# Patient Record
Sex: Female | Born: 1970 | Race: White | Hispanic: No | Marital: Married | Smoking: Never smoker
Health system: Southern US, Community
[De-identification: ages and names within clinical notes are randomized; demographics above are authoritative.]

## PROBLEM LIST (undated history)

## (undated) ENCOUNTER — Ambulatory Visit

## (undated) DIAGNOSIS — F419 Anxiety disorder, unspecified: Secondary | ICD-10-CM

## (undated) DIAGNOSIS — E119 Type 2 diabetes mellitus without complications: Secondary | ICD-10-CM

## (undated) HISTORY — DX: Anxiety disorder, unspecified: F41.9

## (undated) HISTORY — DX: Type 2 diabetes mellitus without complications: E11.9

---

## 2012-10-21 ENCOUNTER — Ambulatory Visit (INDEPENDENT_AMBULATORY_CARE_PROVIDER_SITE_OTHER): Payer: BC Managed Care – PPO | Admitting: Physician Assistant

## 2012-10-21 VITALS — BP 109/74 | HR 89 | Temp 98.5°F | Resp 16 | Ht 64.5 in | Wt 178.0 lb

## 2012-10-21 DIAGNOSIS — R509 Fever, unspecified: Secondary | ICD-10-CM

## 2012-10-21 DIAGNOSIS — J029 Acute pharyngitis, unspecified: Secondary | ICD-10-CM

## 2012-10-21 LAB — POCT RAPID STREP A (OFFICE): Rapid Strep A Screen: NEGATIVE

## 2012-10-21 MED ORDER — AMOXICILLIN 875 MG PO TABS
875.0000 mg | ORAL_TABLET | Freq: Two times a day (BID) | ORAL | Status: DC
Start: 2012-10-21 — End: 2019-11-02

## 2012-10-21 NOTE — Progress Notes (Signed)
  Subjective:    Patient ID: Gabriela Fleming, female    DOB: Mar 18, 1971, 41 y.o.   MRN: [id number]  HPI 41 year old female presents with acute onset of sore throat 3 days ago associated with fevers, chills, and body aches.  Denies cough, nasal congestion, otalgia, headache, nausea, or vomiting. Last fever was on 12/7 and she has been afebrile since. Complains of severe sore throat with painful swallowing and swollen glands. She is a 1st grade teacher and does admit that there are several students with strep. No personal history of strep.  She has DM and HTN - controlled.      Review of Systems  Constitutional: Negative for fever and chills.  HENT: Positive for sore throat. Negative for congestion, rhinorrhea, neck pain and postnasal drip.   Respiratory: Negative for cough.   Gastrointestinal: Negative for nausea and vomiting.  Skin: Negative for rash.  All other systems reviewed and are negative.       Objective:   Physical Exam  Constitutional: She is oriented to person, place, and time. She appears well-developed and well-nourished.  HENT:  Head: Normocephalic and atraumatic.  Right Ear: Hearing, tympanic membrane, external ear and ear canal normal.  Left Ear: Hearing, tympanic membrane, external ear and ear canal normal.  Mouth/Throat: Uvula is midline and mucous membranes are normal. Oropharyngeal exudate and posterior oropharyngeal erythema present. No posterior oropharyngeal edema or tonsillar abscesses.  Eyes: Conjunctivae normal are normal.  Neck: Normal range of motion.  Cardiovascular: Normal rate, regular rhythm and normal heart sounds.   Pulmonary/Chest: Effort normal and breath sounds normal.  Lymphadenopathy:    She has no cervical adenopathy.  Neurological: She is alert and oriented to person, place, and time.  Psychiatric: She has a normal mood and affect. Her behavior is normal. Judgment and thought content normal.          Assessment & Plan:   1. Acute  pharyngitis  POCT rapid strep A, Culture, Group A Strep, amoxicillin (AMOXIL) 875 MG tablet  Throat culture sent Will cover with Amoxicillin due to exposure Increase fluids and rest Tylenol and ibuprofen as needed for pain Follow up if symptoms worsen or fail to improve

## 2012-10-23 LAB — CULTURE, GROUP A STREP: Organism ID, Bacteria: NORMAL

## 2013-05-14 DIAGNOSIS — E785 Hyperlipidemia, unspecified: Secondary | ICD-10-CM | POA: Insufficient documentation

## 2014-03-06 DIAGNOSIS — E1165 Type 2 diabetes mellitus with hyperglycemia: Secondary | ICD-10-CM | POA: Insufficient documentation

## 2014-03-06 DIAGNOSIS — N943 Premenstrual tension syndrome: Secondary | ICD-10-CM | POA: Insufficient documentation

## 2014-03-06 DIAGNOSIS — G43909 Migraine, unspecified, not intractable, without status migrainosus: Secondary | ICD-10-CM | POA: Insufficient documentation

## 2014-03-06 DIAGNOSIS — Z905 Acquired absence of kidney: Secondary | ICD-10-CM | POA: Insufficient documentation

## 2014-06-08 DIAGNOSIS — M722 Plantar fascial fibromatosis: Secondary | ICD-10-CM | POA: Insufficient documentation

## 2014-08-22 ENCOUNTER — Emergency Department (INDEPENDENT_AMBULATORY_CARE_PROVIDER_SITE_OTHER)
Admission: EM | Admit: 2014-08-22 | Discharge: 2014-08-22 | Disposition: A | Discharge status: Home / Self Care | Payer: BC Managed Care – PPO | Source: Home / Self Care | Attending: Emergency Medicine | Admitting: Emergency Medicine

## 2014-08-22 ENCOUNTER — Encounter (HOSPITAL_COMMUNITY): Payer: Self-pay | Admitting: Emergency Medicine

## 2014-08-22 ENCOUNTER — Emergency Department (INDEPENDENT_AMBULATORY_CARE_PROVIDER_SITE_OTHER): Payer: BC Managed Care – PPO

## 2014-08-22 ENCOUNTER — Emergency Department (HOSPITAL_COMMUNITY): Payer: BC Managed Care – PPO

## 2014-08-22 DIAGNOSIS — W19XXXA Unspecified fall, initial encounter: Secondary | ICD-10-CM

## 2014-08-22 DIAGNOSIS — S93409A Sprain of unspecified ligament of unspecified ankle, initial encounter: Secondary | ICD-10-CM

## 2014-08-22 DIAGNOSIS — Y92009 Unspecified place in unspecified non-institutional (private) residence as the place of occurrence of the external cause: Secondary | ICD-10-CM

## 2014-08-22 MED ORDER — HYDROCODONE-ACETAMINOPHEN 5-325 MG PO TABS: ORAL_TABLET | ORAL | Status: DC

## 2014-08-22 NOTE — ED Notes (Signed)
Air cast applied to Left ankle  And  ASO splint applied to Right Ankle.  Crutches fitted and  Patient instructed on  Walking  Return demo done by patient.   Patient Discharged to home via wheelchair accompanied by 2 girlfriends.  Patient verbalizes all instructions

## 2014-08-22 NOTE — ED Provider Notes (Signed)
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 She landed on her feet then to the right. She did not hit her head and there was no loss of consciousness. She has pain in both ankles and unable to a length. The left is worse than the right. There is swelling on both sides. She denies any other injuries.  Review of Systems   Other than as noted above, the patient denies any of the following symptoms: Systemic:  No fevers or chills.   Musculoskeletal:  No joint pain or swelling, back pain, or neck pain. Neurological:  No muscular weakness or paresthesias.  PMFSH   Past medical history, family history, social history, meds, and allergies were reviewed. She has type 2 diabetes and is on metformin, Farsiga, enalapril, and Januvia.  Physical Examination     Vital signs:  BP 110/63  Pulse 78  Temp(Src) 99.3 F (37.4 C) (Oral)  Resp 16  SpO2 98%  LMP 08/01/2014 Gen:  Alert and oriented times 3.  In no distress. Musculoskeletal: Exam of the ankle reveals bilateral ankle swelling, particularly over the lateral malleolus, worse on the left than the right and tenderness to palpation over both malleoli. Anterior drawer sign negative bilaterally.  Talar tilt negative bilaterally. Squeeze test negative bilaterally. Achilles tendon, peroneal tendon, and tibialis posterior were intact. Otherwise, all joints had a full a ROM with no swelling, bruising or deformity.  No edema, pulses full. Extremities were warm and pink.  Capillary refill was brisk.  Skin:  Clear, warm and dry.  No rash. Neuro:  Alert and oriented times 3.  Muscle strength was normal.  Sensation was intact to light touch.   Radiology   Dg Ankle Complete Left  08/22/2014   CLINICAL DATA:  Larey SeatFell down approximately 4 stairs today and injured the left ankle.  Lateral pain. Initial encounter.  EXAM: LEFT ANKLE COMPLETE - 3+ VIEW  COMPARISON:  None.  FINDINGS: Lateral soft tissue swelling. No evidence of acute fracture. Ankle mortise intact with well-preserved joint space. Well-preserved bone mineral density. Small plantar calcaneal spur. No other intrinsic osseous abnormalities. No visible joint effusion.  IMPRESSION: No acute osseous abnormality.   Electronically Signed   By: Hulan Saashomas  Lawrence M.D.   On: 08/22/2014 16:47   Dg Ankle Complete Right  08/22/2014   CLINICAL DATA:  Fall down stairs today, right lateral ankle pain  EXAM: RIGHT ANKLE - COMPLETE 3+ VIEW  COMPARISON:  None.  FINDINGS: Three views of the right ankle submitted. No acute fracture or subluxation. Small plantar spur of calcaneus. Ankle mortise is preserved.  IMPRESSION: Negative.   Electronically Signed   By: Natasha MeadLiviu  Pop M.D.   On: 08/22/2014 17:18   I reviewed the images independently and personally and concur with the radiologist's findings.  [facility]   She was placed in an Aircast on the left and an ASO in the right and given crutches for ambulation.  Assessment   The primary encounter diagnosis was Ankle sprain, unspecified laterality, initial encounter. Diagnoses of Fall with injury and Place of occurrence, home were also pertinent to this visit.  Plan     1.  Meds:  The following meds were prescribed:   New Prescriptions   HYDROCODONE-ACETAMINOPHEN (NORCO/VICODIN) 5-325 MG PER TABLET    1  to 2 tabs every 4 to 6 hours as needed for pain.    2.  Patient Education/Counseling:  The patient was given appropriate handouts, self care instructions, including rest and activity, elevation, application of ice and compression, and instructed in pain control.  No weightbearing for the next 3 days, then began gradually. In ankle exercises.  3.  Follow up:  The patient was told to follow up here if no better in 3 to 4 days, or sooner if becoming worse in any way, and  given some red flag symptoms such as increasing pain or neurological symptoms which would prompt immediate return.  Follow up with Dr. Beverely LowSteve Norris if no better in 2 weeks.     Gabriela Likesavid C Bryan Omura, MD 08/22/14 [phone]

## 2014-08-22 NOTE — ED Notes (Signed)
43 year old female brought to urgent care by a friend.  States she fell down 5-6 steps about 1 hour prior to arrival .  Both ankles are very swollen, and tender to touch.  Ice applied.

## 2014-08-22 NOTE — Discharge Instructions (Signed)

## 2015-10-30 IMAGING — CR DG ANKLE COMPLETE 3+V*R*
3 series · 3 of 3 positions shown · non-contrast
Comparison: None.

CLINICAL DATA: Fall down stairs today, right lateral ankle pain

EXAM:
RIGHT ANKLE - COMPLETE 3+ VIEW

[ankle ap]
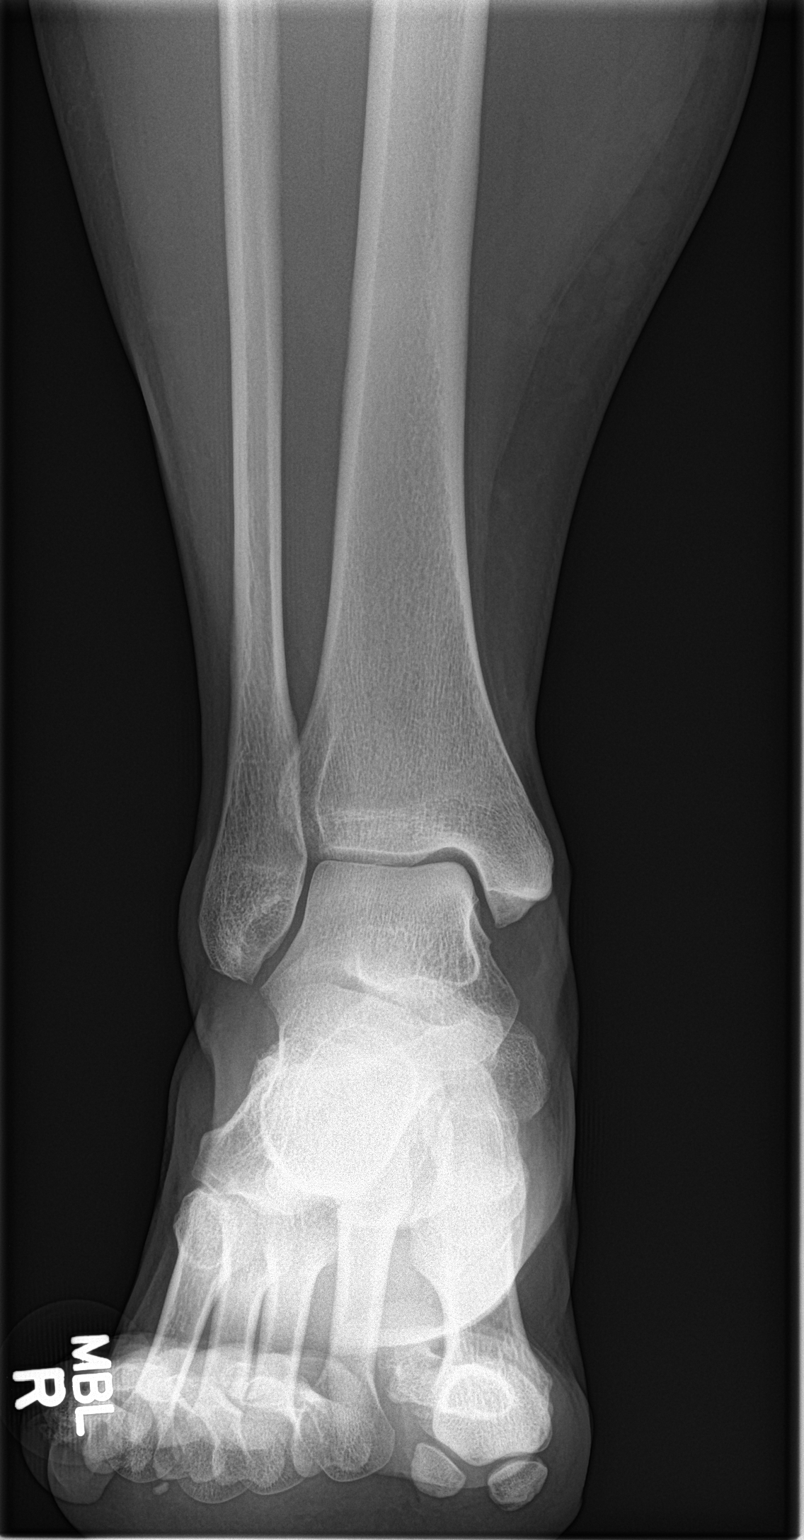

[ankle lat]
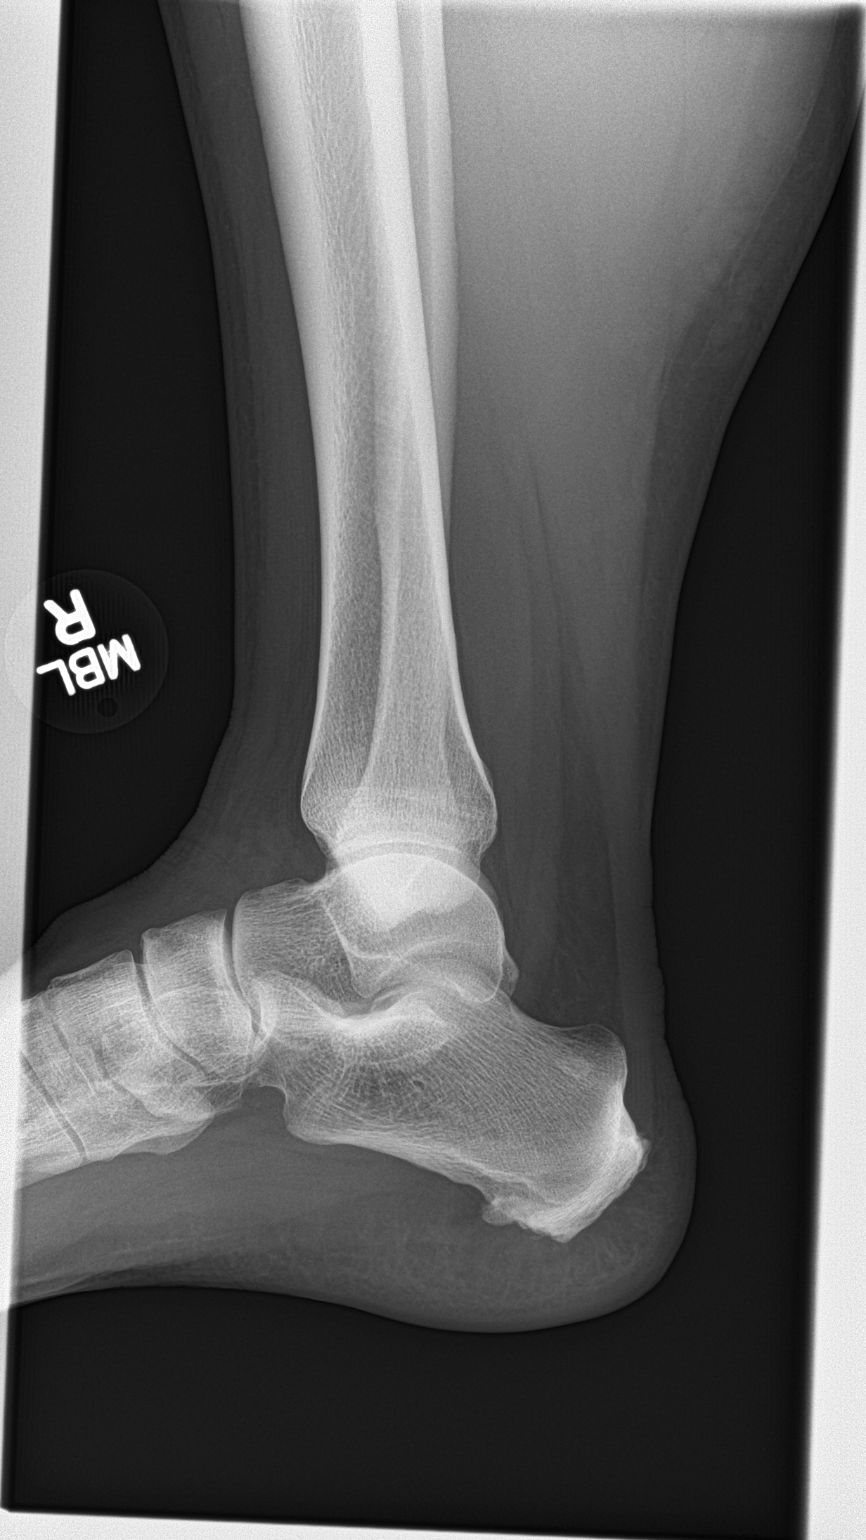

[ankle obl]
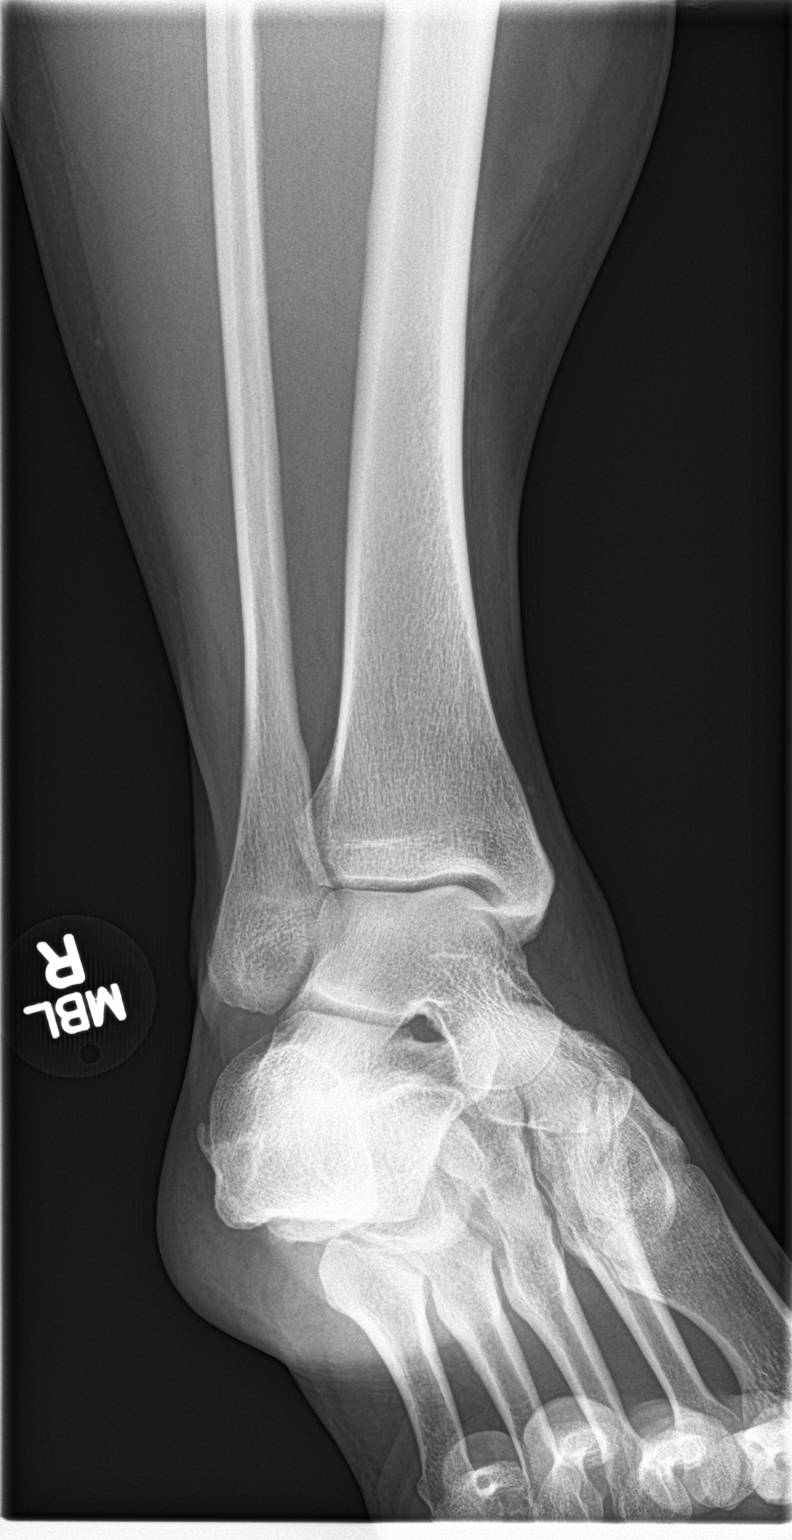

[3 of 3 positions shown; findings below may reference images not displayed]

FINDINGS: Three views of the right ankle submitted. No acute fracture or
subluxation. Small plantar spur of calcaneus. Ankle mortise is
preserved.
IMPRESSION: Negative.

## 2016-02-05 ENCOUNTER — Ambulatory Visit (INDEPENDENT_AMBULATORY_CARE_PROVIDER_SITE_OTHER): Payer: BLUE CROSS/BLUE SHIELD | Admitting: Physician Assistant

## 2016-02-05 VITALS — BP 118/72 | HR 101 | Temp 98.0°F | Resp 18 | Ht 64.5 in | Wt 170.0 lb

## 2016-02-05 DIAGNOSIS — M791 Myalgia, unspecified site: Secondary | ICD-10-CM

## 2016-02-05 LAB — POCT URINALYSIS DIP (MANUAL ENTRY)
Bilirubin, UA: NEGATIVE
Blood, UA: NEGATIVE
Glucose, UA: NEGATIVE
Ketones, POC UA: NEGATIVE
Leukocytes, UA: NEGATIVE
Nitrite, UA: NEGATIVE
PROTEIN UA: NEGATIVE
SPEC GRAV UA: 1.01
Urobilinogen, UA: 0.2
pH, UA: 5.5

## 2016-02-05 LAB — POCT CBC
GRANULOCYTE PERCENT: 64.4 % (ref 37–80)
HCT, POC: 44.3 % (ref 37.7–47.9)
Hemoglobin: 15.9 g/dL (ref 12.2–16.2)
LYMPH, POC: 3.3 (ref 0.6–3.4)
MCH, POC: 30.4 pg (ref 27–31.2)
MCHC: 35.9 g/dL — AB (ref 31.8–35.4)
MCV: 84.7 fL (ref 80–97)
MID (cbc): 0.3 (ref 0–0.9)
MPV: 6.9 fL (ref 0–99.8)
PLATELET COUNT, POC: 327 10*3/uL (ref 142–424)
POC Granulocyte: 6.5 (ref 2–6.9)
POC LYMPH %: 33 % (ref 10–50)
POC MID %: 2.6 %M (ref 0–12)
RBC: 5.23 M/uL (ref 4.04–5.48)
RDW, POC: 13.4 %
WBC: 10.1 10*3/uL (ref 4.6–10.2)

## 2016-02-05 LAB — POCT INFLUENZA A/B
INFLUENZA A, POC: NEGATIVE
Influenza B, POC: NEGATIVE

## 2016-02-05 LAB — POC MICROSCOPIC URINALYSIS (UMFC): MUCUS RE: ABSENT

## 2016-02-05 NOTE — Patient Instructions (Signed)
Take 600 mg of Ibuprofen every eight hours as needed for pain.  Take zyrtec-d in the morning only for allergy symptoms.

## 2016-02-05 NOTE — Progress Notes (Signed)
02/05/2016 2:51 PM   DOB: 06/24/1971 / MRN: [id number]  SUBJECTIVE:  Gabriela Fleming is a 45 y.o. female with a history of well controlled diabetes presenting for myalgia and overwhelming fatigue that started two days ago, however she reports she has not felt well for about 3 weeks.  She associated nasal congestion and waxing and waning generalized non pulsatile HA.  Denies cough, fever, chills, nausea.  Denies dysuria, frequency and urgency.  She has had the seasonal flu shot.    She has No Known Allergies.   She  has a past medical history of Diabetes mellitus without complication (HCC) and Anxiety.    She  reports that she has never smoked. She does not have any smokeless tobacco history on file. She reports that she does not drink alcohol or use illicit drugs. She  reports that she currently engages in sexual activity. She reports using the following method of birth control/protection: Pill. The patient  has no past surgical history on file.  Her family history is not on file.  Review of Systems  Constitutional: Positive for malaise/fatigue. Negative for fever and diaphoresis.  Cardiovascular: Negative for chest pain.  Gastrointestinal: Negative for nausea.  Genitourinary: Negative for dysuria.  Musculoskeletal: Negative for falls.  Skin: Negative for rash.  Neurological: Negative for dizziness.    Problem list and medications reviewed and updated by myself where necessary, and exist elsewhere in the encounter.   OBJECTIVE:  BP 118/72 mmHg  Pulse 101  Temp(Src) 98 F (36.7 C) (Oral)  Resp 18  Ht 5' 4.5" (1.638 m)  Wt 170 lb (77.111 kg)  BMI 28.74 kg/m2  SpO2 99%  LMP 01/08/2016  Physical Exam  Constitutional: She is oriented to person, place, and time. She appears well-nourished. No distress.  HENT:  Right Ear: Tympanic membrane normal.  Left Ear: Tympanic membrane normal.  Nose: Nose normal.  Mouth/Throat: Uvula is midline, oropharynx is clear and moist and mucous  membranes are normal.  Eyes: EOM are normal. Pupils are equal, round, and reactive to light.  Cardiovascular: Normal rate and regular rhythm.   Pulmonary/Chest: Effort normal and breath sounds normal.  Abdominal: She exhibits no distension.  Neurological: She is alert and oriented to person, place, and time. No cranial nerve deficit. Gait normal.  Skin: Skin is dry. She is not diaphoretic.  Psychiatric: She has a normal mood and affect.  Vitals reviewed.   Results for orders placed or performed in visit on 02/05/16 (from the past 72 hour(s))  POCT Influenza A/B     Status: None   Collection Time: 02/05/16  2:37 PM  Result Value Ref Range   Influenza A, POC Negative Negative   Influenza B, POC Negative Negative    No results found.  ASSESSMENT AND PLAN  Gabriela Fleming was seen today for flu.  Diagnoses and all orders for this visit:  Myalgia: 45 y.o. female here today with myalgia for the last two days.  Her work up is negative.  She is not complaining of dysuria, urgency a and frequency.  Will send for a culture given leuks on micro. Advised symptomatic care for now with Ibuprofen and Zyrtec-D.        -     POCT Influenza A/B -     POCT CBC -     POCT urinalysis dipstick -     POCT Microscopic Urinalysis ([facility])    The patient was advised to call or return to clinic if she does not see an  improvement in symptoms or to seek the care of the closest emergency department if she worsens with the above plan.   Deliah Boston, MHS, PA-C [facility] 02/05/2016 2:51 PM

## 2016-02-06 LAB — URINE CULTURE: Colony Count: 75000

## 2016-02-17 ENCOUNTER — Encounter: Payer: Self-pay | Admitting: *Deleted

## 2017-01-03 DIAGNOSIS — D259 Leiomyoma of uterus, unspecified: Secondary | ICD-10-CM | POA: Insufficient documentation

## 2017-01-03 DIAGNOSIS — N939 Abnormal uterine and vaginal bleeding, unspecified: Secondary | ICD-10-CM | POA: Insufficient documentation

## 2017-01-03 DIAGNOSIS — E669 Obesity, unspecified: Secondary | ICD-10-CM | POA: Insufficient documentation

## 2017-01-03 DIAGNOSIS — N83201 Unspecified ovarian cyst, right side: Secondary | ICD-10-CM | POA: Insufficient documentation

## 2017-01-03 DIAGNOSIS — R232 Flushing: Secondary | ICD-10-CM | POA: Insufficient documentation

## 2017-01-21 DIAGNOSIS — Z86018 Personal history of other benign neoplasm: Secondary | ICD-10-CM | POA: Insufficient documentation

## 2017-05-25 DIAGNOSIS — E042 Nontoxic multinodular goiter: Secondary | ICD-10-CM | POA: Insufficient documentation

## 2019-11-02 ENCOUNTER — Ambulatory Visit
Admission: EM | Admit: 2019-11-02 | Discharge: 2019-11-02 | Disposition: A | Discharge status: Home / Self Care | Payer: BC Managed Care – PPO | Attending: Physician Assistant | Admitting: Physician Assistant

## 2019-11-02 ENCOUNTER — Other Ambulatory Visit: Payer: Self-pay

## 2019-11-02 DIAGNOSIS — R5383 Other fatigue: Secondary | ICD-10-CM | POA: Diagnosis not present

## 2019-11-02 DIAGNOSIS — Z20828 Contact with and (suspected) exposure to other viral communicable diseases: Secondary | ICD-10-CM

## 2019-11-02 NOTE — Discharge Instructions (Signed)
COVID PCR testing ordered. I would like you to quarantine until testing results. You can take over the counter flonase/nasacort to help with nasal congestion/drainage. If experiencing shortness of breath, trouble breathing, go to the emergency department for further evaluation needed. 

## 2019-11-02 NOTE — ED Triage Notes (Signed)
Pt c/o a runny nose and fatigue since yesterday, want COVID testing

## 2019-11-02 NOTE — ED Provider Notes (Signed)
[name]    CSN: [id number] Arrival date & time: 11/02/19  1324      History   Chief Complaint Chief Complaint  Patient presents with  . Fatigue    HPI Gabriela Fleming is a 48 y.o. female.   48 year old female comes in for 2 day of URI symptoms. Rhinorrhea, nasal congestion without cough, sore throat. Has had some fatigue. Denies fever, chills, body aches. Denies abdominal pain, nausea, vomiting, diarrhea. Denies shortness of breath, loss of taste/smell. Never smoker. 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 No obvious sick/COVID contact.   Last a1c 9.1     Past Medical History:  Diagnosis Date  . Anxiety   . Diabetes mellitus without complication (HCC)     There are no problems to display for this patient.   History reviewed. No pertinent surgical history.  OB History   No obstetric history on file.      Home Medications    Prior to Admission medications   Medication Sig Start Date End Date Taking? Authorizing Provider  enalapril (VASOTEC) 10 MG tablet Take 10 mg by mouth daily.    [provider]  FLUoxetine (PROZAC) 10 MG tablet Take 10 mg by mouth daily.    [provider]  Liraglutide (VICTOZA Perryman) Inject into the skin.    [provider]  metFORMIN (GLUCOPHAGE) 1000 MG tablet Take 1,000 mg by mouth 2 (two) times daily with a meal.    [provider]  Norethindrone Acetate-Ethinyl Estradiol (MICROGESTIN) 1.5-30 MG-MCG tablet Take 1 tablet by mouth daily.    [provider]    Family History History reviewed. No pertinent family history.  Social History Social History   Tobacco Use  . Smoking status: Never Smoker  . Smokeless tobacco: Never Used  Substance Use Topics  . Alcohol use: No  . Drug use: No     Allergies   Patient has no known allergies.   Review of Systems Review of Systems  Reason unable to perform ROS: See HPI as above.     Physical Exam Triage  Vital Signs ED Triage Vitals [11/02/19 0908]  Enc Vitals Group     BP (!) 93/55     Pulse Rate 80     Resp 18     Temp 98.1 F (36.7 C)     Temp Source Oral     SpO2 96 %     Weight      Height      Head Circumference      Peak Flow      Pain Score 0     Pain Loc      Pain Edu?      Excl. in GC?    No data found.  Updated Vital Signs BP 101/68 (BP Location: Left Arm)   Pulse 80   Temp 98.1 F (36.7 C) (Oral)   Resp 18   LMP 10/03/2019   SpO2 96%   Physical Exam Constitutional:      General: She is not in acute distress.    Appearance: Normal appearance. She is not ill-appearing, toxic-appearing or diaphoretic.  HENT:     Head: Normocephalic and atraumatic.     Mouth/Throat:     Mouth: Mucous membranes are moist.     Pharynx: Oropharynx is clear. Uvula midline.  Cardiovascular:     Rate and Rhythm: Normal rate and regular rhythm.     Heart sounds: Normal heart sounds.  No murmur. No friction rub. No gallop.   Pulmonary:     Effort: Pulmonary effort is normal. No accessory muscle usage, prolonged expiration, respiratory distress or retractions.     Comments: Lungs clear to auscultation without adventitious lung sounds. Musculoskeletal:     Cervical back: Normal range of motion and neck supple.  Neurological:     General: No focal deficit present.     Mental Status: She is alert and oriented to person, place, and time.      UC Treatments / Results  Labs (all labs ordered are listed, but only abnormal results are displayed) Labs Reviewed  NOVEL CORONAVIRUS, NAA    EKG   Radiology No results found.  Procedures Procedures (including critical care time)  Medications Ordered in UC Medications - No data to display  Initial Impression / Assessment and Plan / UC Course  I have reviewed the triage vital signs and the nursing notes.  Pertinent labs & imaging results that were available during my care of the patient were reviewed by me and considered in my  medical decision making (see chart for details).    BP 93/55, with repeat at 101/68. No tachycardia. Patient denies weakness, dizziness, syncope.  Denies any active bleeding.  No recent CBC seen, offered CBC testing, patient deferred at this time.   COVID PCR test ordered. Patient to quarantine until testing results return. No alarming signs on exam.  Patient speaking in full sentences without respiratory distress.  Symptomatic treatment discussed.  Push fluids.  Return precautions given.  Patient expresses understanding and agrees to plan.  Final Clinical Impressions(s) / UC Diagnoses   Final diagnoses:  Fatigue, unspecified type   ED Prescriptions    None     PDMP not reviewed this encounter.   Ok Edwards, PA-C 11/02/19 [phone]

## 2019-11-03 LAB — NOVEL CORONAVIRUS, NAA: SARS-CoV-2, NAA: NOT DETECTED

## 2020-03-26 DIAGNOSIS — F419 Anxiety disorder, unspecified: Secondary | ICD-10-CM | POA: Insufficient documentation

## 2021-03-17 ENCOUNTER — Other Ambulatory Visit: Payer: Self-pay

## 2021-03-17 ENCOUNTER — Ambulatory Visit
Admission: EM | Admit: 2021-03-17 | Discharge: 2021-03-17 | Disposition: A | Discharge status: Home / Self Care | Payer: Managed Care, Other (non HMO) | Attending: Family Medicine | Admitting: Family Medicine

## 2021-03-17 ENCOUNTER — Ambulatory Visit (INDEPENDENT_AMBULATORY_CARE_PROVIDER_SITE_OTHER): Payer: Managed Care, Other (non HMO)

## 2021-03-17 ENCOUNTER — Encounter: Payer: Self-pay | Admitting: *Deleted

## 2021-03-17 DIAGNOSIS — R059 Cough, unspecified: Secondary | ICD-10-CM | POA: Diagnosis not present

## 2021-03-17 DIAGNOSIS — R0602 Shortness of breath: Secondary | ICD-10-CM

## 2021-03-17 DIAGNOSIS — U071 COVID-19: Secondary | ICD-10-CM | POA: Diagnosis not present

## 2021-03-17 DIAGNOSIS — J988 Other specified respiratory disorders: Secondary | ICD-10-CM

## 2021-03-17 MED ORDER — AZITHROMYCIN 250 MG PO TABS: ORAL_TABLET | ORAL | 0 refills | Status: DC

## 2021-03-17 MED ORDER — BENZONATATE 200 MG PO CAPS: 200.0000 mg | ORAL_CAPSULE | Freq: Three times a day (TID) | ORAL | 0 refills | Status: DC | PRN

## 2021-03-17 NOTE — ED Notes (Signed)
Called.

## 2021-03-17 NOTE — ED Provider Notes (Signed)
[name]    CSN: [id number] Arrival date & time: 03/17/21  1254      History   Chief Complaint Chief Complaint  Patient presents with  . Cough    HPI Gabriela Fleming is a 50 y.o. female.   HPI  Patient diagnosed with COVID-19 on 03/09/2021 presents today for evaluation of a persistent cough.  She denies any shortness of breath at rest however endorses some shortness of breath only with coughing.  She also is having some nasal congestion symptoms lingering.  She has not had fever for over a week.  In general her symptoms have rapidly improved.  She has no history of recurrent pneumonia and she is not a smoker.  She is a type II diabetic last A1c was greater than 7..  Past Medical History:  Diagnosis Date  . Anxiety   . Diabetes mellitus without complication (HCC)     There are no problems to display for this patient.   History reviewed. No pertinent surgical history.  OB History   No obstetric history on file.      Home Medications    Prior to Admission medications   Medication Sig Start Date End Date Taking? Authorizing Provider  azithromycin (ZITHROMAX) 250 MG tablet Take 2 tabs PO x 1 dose, then 1 tab PO QD x 4 days 03/17/21  Yes Bing Neighbors, FNP  benzonatate (TESSALON) 200 MG capsule Take 1 capsule (200 mg total) by mouth 3 (three) times daily as needed for cough. 03/17/21  Yes Bing Neighbors, FNP  enalapril (VASOTEC) 10 MG tablet Take 10 mg by mouth daily.    [provider]  FLUoxetine (PROZAC) 10 MG tablet Take 10 mg by mouth daily.    [provider]  Liraglutide (VICTOZA Moody) Inject into the skin.    [provider]  metFORMIN (GLUCOPHAGE) 1000 MG tablet Take 1,000 mg by mouth 2 (two) times daily with a meal.    [provider]  Norethindrone Acetate-Ethinyl Estradiol (MICROGESTIN) 1.5-30 MG-MCG tablet Take 1 tablet by mouth daily.    [provider]    Family History History reviewed. No  pertinent family history.  Social History Social History   Tobacco Use  . Smoking status: Never Smoker  . Smokeless tobacco: Never Used  Substance Use Topics  . Alcohol use: No  . Drug use: No     Allergies   Patient has no known allergies.   Review of Systems Review of Systems Pertinent negatives listed in HPI  Physical Exam Triage Vital Signs ED Triage Vitals  Enc Vitals Group     BP 03/17/21 1334 119/82     Pulse Rate 03/17/21 1334 89     Resp 03/17/21 1334 18     Temp 03/17/21 1334 97.8 F (36.6 C)     Temp Source 03/17/21 1334 Oral     SpO2 03/17/21 1334 98 %     Weight --      Height --      Head Circumference --      Peak Flow --      Pain Score 03/17/21 1400 0     Pain Loc --      Pain Edu? --      Excl. in GC? --    No data found.  Updated Vital Signs BP 119/82 (BP Location: Left Arm)   Pulse 89   Temp 97.8 F (36.6 C) (Oral)   Resp 18   LMP 02/15/2021  SpO2 98%   Visual Acuity Right Eye Distance:   Left Eye Distance:   Bilateral Distance:    Right Eye Near:   Left Eye Near:    Bilateral Near:     Physical Exam General appearance: alert, Ill-appearing, no distress Head: Normocephalic, without obvious abnormality, atraumatic ENT: Ears normal, nares congestion,pharynx patent  w/o exudate Respiratory: Respirations even , unlabored, coarse lung sounds  Heart: rate and rhythm normal. No gallop or murmurs noted on exam  Abdomen: BS +, no distention, no rebound tenderness, or no mass Extremities: No gross deformities Skin: Skin color, texture, turgor normal. No rashes seen  Psych: Appropriate mood and affect. Neurologic: GCS 15, normal coordination normal  UC Treatments / Results  Labs (all labs ordered are listed, but only abnormal results are displayed) Labs Reviewed - No data to display  EKG   Radiology DG Chest 2 View  Result Date: 03/17/2021 CLINICAL DATA:  Cough. Recent COVID-19 positive. Shortness of breath. EXAM: CHEST - 2  VIEW COMPARISON:  None. FINDINGS: Lungs are clear. Heart size and pulmonary vascularity are normal. No adenopathy. No bone lesions. IMPRESSION: Lungs clear.  Cardiac silhouette normal. Electronically Signed   By: Bretta Bang III M.D.   On: 03/17/2021 14:12    Procedures Procedures (including critical care time)  Medications Ordered in UC Medications - No data to display  Initial Impression / Assessment and Plan / UC Course  I have reviewed the triage vital signs and the nursing notes.  Pertinent labs & imaging results that were available during my care of the patient were reviewed by me and considered in my medical decision making (see chart for details).     Patient presents status post COVID diagnosis on 03/09/2021 lung exam significant for respiratory tract illness, patient is a diabetic therefore will avoid any steroids or treatments today.  Will cover with azithromycin for any secondary bacterial infection as well as benzonatate for cough.  Return precautions given.  Chest x-ray is negative. Final Clinical Impressions(s) / UC Diagnoses   Final diagnoses:  Respiratory tract infection due to COVID-19 virus   Discharge Instructions   None    ED Prescriptions    Medication Sig Dispense Auth. Provider   azithromycin (ZITHROMAX) 250 MG tablet Take 2 tabs PO x 1 dose, then 1 tab PO QD x 4 days 6 tablet Bing Neighbors, FNP   benzonatate (TESSALON) 200 MG capsule Take 1 capsule (200 mg total) by mouth 3 (three) times daily as needed for cough. 30 capsule Bing Neighbors, FNP     PDMP not reviewed this encounter.   Bing Neighbors, FNP 03/17/21 1745

## 2021-03-17 NOTE — ED Triage Notes (Signed)
Pt has ongoing cough following  A positive COVID test.

## 2022-03-31 DIAGNOSIS — R42 Dizziness and giddiness: Secondary | ICD-10-CM | POA: Insufficient documentation

## 2023-11-20 DIAGNOSIS — E1165 Type 2 diabetes mellitus with hyperglycemia: Secondary | ICD-10-CM | POA: Insufficient documentation

## 2023-11-21 DIAGNOSIS — E611 Iron deficiency: Secondary | ICD-10-CM | POA: Insufficient documentation

## 2024-03-28 ENCOUNTER — Ambulatory Visit
Admission: EM | Admit: 2024-03-28 | Discharge: 2024-03-28 | Disposition: A | Discharge status: Home / Self Care | Attending: Family Medicine | Admitting: Family Medicine

## 2024-03-28 ENCOUNTER — Other Ambulatory Visit: Payer: Self-pay

## 2024-03-28 DIAGNOSIS — J209 Acute bronchitis, unspecified: Secondary | ICD-10-CM | POA: Diagnosis not present

## 2024-03-28 MED ORDER — AZITHROMYCIN 250 MG PO TABS: 250.0000 mg | ORAL_TABLET | Freq: Every day | ORAL | 0 refills | Status: AC

## 2024-03-28 MED ORDER — BENZONATATE 200 MG PO CAPS
200.0000 mg | ORAL_CAPSULE | Freq: Three times a day (TID) | ORAL | 0 refills | Status: AC | PRN
Start: 2024-03-28 — End: ?

## 2024-03-28 NOTE — Discharge Instructions (Addendum)
 Start azithromycin  as prescribed.  May take Tessalon  3 times a day as needed for your cough.  Lots of rest and fluids.  Please follow-up with your PCP if your symptoms do not improve.  Please go to the ER for any worsening symptoms.  Hope you feel better soon!

## 2024-03-28 NOTE — ED Provider Notes (Signed)
 [facility]    CSN: [id number] Arrival date & time: 03/28/24  1349      History   Chief Complaint No chief complaint on file.   HPI Gabriela Fleming is a 53 y.o. female  presents for evaluation of URI symptoms for 2 weeks. Patient reports associated symptoms of cough, congestion, chest pain with coughing. Denies N/V/D, fevers, ear pain, sore throat, body aches, shortness of breath. Patient does not have a hx of asthma. Patient is not an active smoker.   Reports sick contacts via her job.  Pt has taken cough medicine OTC for symptoms. Pt has no other concerns at this time.   HPI  Past Medical History:  Diagnosis Date   Anxiety    Diabetes mellitus without complication [facility])     Patient Active Problem List   Diagnosis Date Noted   Dietary iron deficiency without anemia 11/21/2023   Type 2 diabetes mellitus with hyperglycemia, without long-term current use of insulin (HCC) 11/20/2023   Vertigo 03/31/2022   Anxiety 03/26/2020   Multiple thyroid nodules 05/25/2017   History of uterine fibroid 01/21/2017   Abnormal uterine bleeding 01/03/2017   Cysts of both ovaries 01/03/2017   Flushing 01/03/2017   Obesity with body mass index 30 or greater 01/03/2017   Uterine leiomyoma 01/03/2017   Plantar fasciitis, bilateral 06/08/2014   Acquired absence of kidney 03/06/2014   Migraine 03/06/2014   Premenstrual tension syndrome 03/06/2014   Uncontrolled type 2 diabetes mellitus with hyperglycemia (HCC) 03/06/2014   Hyperlipidemia 05/14/2013    History reviewed. No pertinent surgical history.  OB History   No obstetric history on file.      Home Medications    Prior to Admission medications   Medication Sig Start Date End Date Taking? Authorizing Provider  azithromycin  (ZITHROMAX ) 250 MG tablet Take 1 tablet (250 mg total) by mouth daily. Take first 2 tablets together, then 1 every day until finished. 03/28/24  Yes Halo Laski, Jodi R, NP  benzonatate  (TESSALON ) 200 MG  capsule Take 1 capsule (200 mg total) by mouth 3 (three) times daily as needed. 03/28/24  Yes Chanice Brenton, Jodi R, NP  atorvastatin (LIPITOR) 10 MG tablet Take 1 tablet by mouth daily. 05/14/17   [provider]  Blood Glucose Monitoring Suppl (GHT BLOOD GLUCOSE MONITOR) w/Device KIT Use as instructed--provide glucometer which is covered by pt's insurance 03/15/15   [provider]  cetirizine-pseudoephedrine (ZYRTEC-D) 5-120 MG tablet Take 1 tablet by mouth. 01/09/22   [provider]  Continuous Glucose Receiver (FREESTYLE LIBRE READER) DEVI 1 each by Does not apply route. 11/20/23   [provider]  cyclobenzaprine (FLEXERIL) 10 MG tablet Take one tablet at night, or up to three times daily as needed for back pain and spasm 06/16/16   [provider]  dapagliflozin propanediol (FARXIGA) 10 MG TABS tablet Take 1 tablet by mouth daily. 04/25/21   [provider]  empagliflozin (JARDIANCE) 25 MG TABS tablet Take 1 tablet by mouth daily. 05/14/17   [provider]  enalapril (VASOTEC) 10 MG tablet Take 10 mg by mouth daily.    [provider]  enalapril (VASOTEC) 2.5 MG tablet Take 1 tablet by mouth daily. 05/14/17   [provider]  Exenatide ER 2 MG/0.85ML AUIJ Inject 2 mg into the skin. 12/28/16   [provider]  FLUoxetine (PROZAC) 10 MG tablet Take 10 mg by mouth daily.    [provider]  FLUoxetine (PROZAC) 40 MG capsule Take 1 capsule  by mouth daily. 11/16/22   [provider]  Fluoxetine HCl, PMDD, 20 MG TABS Take 30 mg by mouth. 05/14/17   [provider]  glucose blood (PRECISION QID TEST) test strip Check glucose in the morning prior to breakfast 03/15/15   [provider]  insulin degludec (TRESIBA FLEXTOUCH) 100 UNIT/ML FlexTouch Pen Inject 20 Units into the skin daily. 01/04/23   [provider]  insulin degludec (TRESIBA FLEXTOUCH) 200 UNIT/ML FlexTouch Pen INJECT 12 UNITS INTO  THE SKIN DAILY. 10/21/21   [provider]  insulin lispro (HUMALOG) 100 UNIT/ML KwikPen Inject 6 Units into the skin. 12/13/21   [provider]  Insulin Pen Needle (BD PEN NEEDLE MINI ULTRAFINE) 31G X 5 MM MISC Check once daily 03/06/22   [provider]  Insulin Pen Needle (NOVOFINE PEN NEEDLE) 32G X 6 MM MISC Use once a day for administration of Victoza 05/08/16   [provider]  Liraglutide (VICTOZA Valdez) Inject into the skin.    [provider]  metFORMIN (GLUCOPHAGE) 1000 MG tablet Take 1,000 mg by mouth 2 (two) times daily with a meal.    [provider]  metFORMIN (GLUCOPHAGE-XR) 500 MG 24 hr tablet TAKE 4 TABLETS BY MOUTH IN THE EVENING    [provider]  Multiple Vitamin (DAILY VITAMINS) tablet Take 1 tablet by mouth daily.    [provider]  Norethindrone Acetate-Ethinyl Estradiol (MICROGESTIN) 1.5-30 MG-MCG tablet Take 1 tablet by mouth daily.    [provider]  norethindrone-ethinyl estradiol (LOESTRIN) 1-20 MG-MCG tablet Take 1 tablet by mouth daily. 03/30/21   [provider]  ondansetron (ZOFRAN) 8 MG tablet Take 8 mg by mouth. 11/01/22   [provider]  Semaglutide, 2 MG/DOSE, (OZEMPIC, 2 MG/DOSE,) 8 MG/3ML SOPN Inject 2 mg into the skin. 10/19/22   [provider]  SUMAtriptan (IMITREX) 50 MG tablet PLEASE SEE ATTACHED FOR DETAILED DIRECTIONS 01/24/17   [provider]  tirzepatide (MOUNJARO) 10 MG/0.5ML Pen INJECT 0.5 MLS (10 MG DOSE) INTO THE SKIN ONCE A WEEK.    [provider]  tirzepatide Florence Hunt) 12.5 MG/0.5ML Pen Inject 12.5 mg into the skin. 02/21/24   [provider]  tirzepatide Florence Hunt) 5 MG/0.5ML Pen INJECT 5 MG SUBCUTANEOUSLY EVERY 7 DAYS 10/24/23   [provider]    Family History History reviewed. No pertinent family history.  Social History Social History   Tobacco Use   Smoking status: Never   Smokeless tobacco:  Never  Substance Use Topics   Alcohol use: No   Drug use: No     Allergies   Patient has no known allergies.   Review of Systems Review of Systems  HENT:  Positive for congestion.   Respiratory:  Positive for cough.      Physical Exam Triage Vital Signs ED Triage Vitals  Encounter Vitals Group     BP 03/28/24 1411 112/79     Systolic BP Percentile --      Diastolic BP Percentile --      Pulse Rate 03/28/24 1411 97     Resp 03/28/24 1411 16     Temp 03/28/24 1411 98.4 F (36.9 C)     Temp Source 03/28/24 1411 Oral     SpO2 03/28/24 1411 98 %     Weight --      Height --      Head Circumference --      Peak Flow --      Pain Score  03/28/24 1406 2     Pain Loc --      Pain Education --      Exclude from Growth Chart --    No data found.  Updated Vital Signs BP 112/79   Pulse 97   Temp 98.4 F (36.9 C) (Oral)   Resp 16   SpO2 98%   Visual Acuity Right Eye Distance:   Left Eye Distance:   Bilateral Distance:    Right Eye Near:   Left Eye Near:    Bilateral Near:     Physical Exam Vitals and nursing note reviewed.  Constitutional:      General: She is not in acute distress.    Appearance: She is well-developed. She is not ill-appearing.  HENT:     Head: Normocephalic and atraumatic.     Right Ear: Tympanic membrane and ear canal normal.     Left Ear: Tympanic membrane and ear canal normal.     Nose: Congestion present.     Mouth/Throat:     Mouth: Mucous membranes are moist.     Pharynx: Oropharynx is clear. Uvula midline. No posterior oropharyngeal erythema.     Tonsils: No tonsillar exudate or tonsillar abscesses.  Eyes:     Conjunctiva/sclera: Conjunctivae normal.     Pupils: Pupils are equal, round, and reactive to light.  Cardiovascular:     Rate and Rhythm: Normal rate and regular rhythm.     Heart sounds: Normal heart sounds.  Pulmonary:     Effort: Pulmonary effort is normal.     Breath sounds: Normal breath sounds. No wheezing,  rhonchi or rales.  Musculoskeletal:     Cervical back: Normal range of motion and neck supple.  Lymphadenopathy:     Cervical: No cervical adenopathy.  Skin:    General: Skin is warm and dry.  Neurological:     General: No focal deficit present.     Mental Status: She is alert and oriented to person, place, and time.  Psychiatric:        Mood and Affect: Mood normal.        Behavior: Behavior normal.      UC Treatments / Results  Labs (all labs ordered are listed, but only abnormal results are displayed) Labs Reviewed - No data to display  EKG   Radiology No results found.  Procedures Procedures (including critical care time)  Medications Ordered in UC Medications - No data to display  Initial Impression / Assessment and Plan / UC Course  I have reviewed the triage vital signs and the nursing notes.  Pertinent labs & imaging results that were available during my care of the patient were reviewed by me and considered in my medical decision making (see chart for details).     Reviewed exam and symptoms with patient.  No red flags.  Patient declined chest x-ray.  Will treat for bronchitis with Zithromycin and Tessalon .  Advise rest fluids and PCP follow-up if symptoms do not improve.  ER precautions reviewed and patient verbalized understanding. Final Clinical Impressions(s) / UC Diagnoses   Final diagnoses:  Acute bronchitis, unspecified organism     Discharge Instructions      Start azithromycin  as prescribed.  May take Tessalon  3 times a day as needed for your cough.  Lots of rest and fluids.  Please follow-up with your PCP if your symptoms do not improve.  Please go to the ER for any worsening symptoms.  Hope you feel better soon!  ED  Prescriptions     Medication Sig Dispense Auth. Provider   azithromycin  (ZITHROMAX ) 250 MG tablet Take 1 tablet (250 mg total) by mouth daily. Take first 2 tablets together, then 1 every day until finished. 6 tablet Lateshia Schmoker, Jodi  R, NP   benzonatate  (TESSALON ) 200 MG capsule Take 1 capsule (200 mg total) by mouth 3 (three) times daily as needed. 20 capsule Leana Springston, Jodi R, NP      PDMP not reviewed this encounter.   Alleen Arbour, NP 03/28/24 [phone]

## 2024-03-28 NOTE — ED Triage Notes (Signed)
 Pt c/o dry cough, chest pressure across entire chestx2wks.

## 2024-08-15 NOTE — Progress Notes (Signed)
 Subjective   Patient ID:  Gabriela Fleming is a 53 y.o. (DOB 1971-05-30) female.     Patient presents with   Follow-up    Pt presents today to follow up on BP medication. She was seen at [facility] recently and they had her stop the enalapril as they felt it was causing her cough. She has stopped it and cough is better. Pt wonders about something else for her BP. UC gave her Valsartan but its a high dose so she has been afraid to take.  Also would like to start back taking mounjaro.      Patients PMH, Surgical History, and Social History as well as medications and allergies all reviewed and updated. Allergies[1]  Patient presents for chronic problem follow up for DM. Patient was seen at Urgent Care a few weeks ago for cough and her enalapril was stopped. Patient was started on Valsartan but she hasn't started this yet.  No blurry vision or vision changes. No change in exercise tolerance.  Denies chest pain or SOB. No headache. No dizziness.  No abdominal pain.  No lower extremity edema. No arthralgias or myalgias. Patient's been off of her Mounjaro since July d/t concern that her cough may have been from the Mounjaro- she would like to restart this.  Last labs reviewed and potassium and kidney function values are listed below. Patient also admits to a strong FH of difficulty with menopause. Patient would like to discuss HRT due to: fatigue, brain fog, difficulty with word finding, and hot flashes  Lab Results  Component Value Date   Potassium 4.6 05/15/2024   Lab Results  Component Value Date   Creatinine 0.74 05/15/2024   BUN 10 05/15/2024   Sodium 139 05/15/2024   Potassium 4.6 05/15/2024   Chloride 100 05/15/2024   CO2 18 (L) 05/15/2024    Review of Systems:  All other systems reviewed and are negative.     Objective   BP 122/78   Pulse 76   Temp 97.7 F (36.5 C)   Resp 18   Ht 5' 3.75 (1.619 m)   Wt 187 lb 6.4 oz (85 kg)   LMP  (LMP Unknown)   SpO2 97%   Breastfeeding No   BMI  32.42 kg/m  General:  Well Developed, Well Nourished, Non-distressed. Pleasant and Interactive. Neck:  Supple with normal range of motion, No lymphadenopathy, JVD, bruits, or thyromegaly Cardiovascular:  Heart with regular, rate, and rhythm without murmurs, gallops, or rubs Lungs:  Clear to auscultation anterior and posterior without wheezing, rhonchi, or rales  Skin:  Warm, dry.  No rash visible. Extremities:  No clubbing, cyanosis, or edema.  Dorsalis pedis and posterior tibial pulses present and equal.   Impression / Plan   1. Type 2 diabetes mellitus without complication, without long-term current use of insulin (*)  valsartan (DIOVAN) 40 MG tablet   tirzepatide (MOUNJARO) 2.5 mg/0.5 mL SOAJ pen injection   Hemoglobin A1c   Hemoglobin A1c    2. Menopausal symptoms  estradiol-norethindrone (COMBIPATCH) 0.05-0.14 MG/DAY   Chronic but worsening: fatigue, brain fog,  difficulty with word finding (names), hot flashes        Patient instructions and education given as below.  Advised to take medications as described below.   Recommend to start daily valsartan and weekly mounjaro for DM treatment Will start trial of Combipatch per patient preference. She'll reach out should discussed side effects occur. Labs ordered today and I will follow up with the patient with any  additional recommendations once the results are available.   Advised patient to keep regular med follow up Follow up if symptoms worsen or fail to improve.  Future Appointments  Date Time Provider Department Center  10/07/2024  9:00 AM Tinnie FORBES Salt, PA [facility] None      Medication list was reviewed, updated, and a copy provided to patient upon leaving.    Patient's Medications       * Accurate as of August 15, 2024 11:59 PM. Reflects encounter med changes as of last refresh          New Prescriptions      Instructions  COMBIPATCH 0.05-0.14 MG/DAY Generic drug: estradiol-norethindrone Start taking on:  August 18, 2024 Started by: Tinnie Salt  1 patch, Transdermal, 2 times weekly   MOUNJARO 2.5 MG/0.5ML Soaj pen injection Generic drug: tirzepatide Replaces: MOUNJARO 15 MG/0.5ML Soaj injection Started by: Tinnie Salt  2.5 mg, Subcutaneous, Weekly       Continued Medications      Instructions  atorvastatin calcium 10 mg tablet Commonly known as: LIPITOR  1 tablet, Daily   FLUoxetine 20 MG tablet Commonly known as: PROZAC  20 mg, Daily   FREESTYLE LIBRE READER Devi  1 each, Does not apply, Daily, Dispense and use as prescribed.   FREESTYLE LIBRE SENSOR SYSTEM Misc  1 each, Does not apply, Daily, Dispense and use as prescribed.   metFORMIN ER 500 mg 24 hr tablet Commonly known as: GLUCOPHAGE-XR  500 mg, With supper   omeprazole 40 mg capsule Commonly known as: PRILOSEC  40 mg, Daily   SUMAtriptan succinate 50 mg tablet Commonly known as: IMITREX  50 mg, Every 2 hours as needed   TRESIBA FLEXTOUCH 100 UNIT/ML injection Generic drug: Insulin Degludec  20 Units, Subcutaneous, Daily       Modified Medications      Instructions  valsartan 40 MG tablet Commonly known as: DIOVAN What changed: See the new instructions. Changed by: Tinnie Salt  40 mg, Oral, Daily       Discontinued Medications    enalapril maleate 10 mg tablet Commonly known as: VASOTEC Stopped by: Lauren Young   MOUNJARO 15 MG/0.5ML Soaj injection Generic drug: tirzepatide Replaced by: MOUNJARO 2.5 MG/0.5ML Soaj pen injection Stopped by: Tinnie Salt        Orders Placed This Encounter  Procedures   Flucelvax Trivalent Preservative Free Prefilled Syringe   Hemoglobin A1c   Handout given to the patient including the following instructions: There are no Patient Instructions on file for this visit.                   [1] No Known Allergies *Some images could not be shown.

## 2024-10-07 NOTE — Progress Notes (Signed)
 Subjective   Patient ID:  Gabriela Fleming is a 53 y.o. (DOB December 27, 1970) female.     Patient presents with   Diabetes     History of Present Illness The patient presents for evaluation of weight management, menopausal symptoms, and blood glucose management.  She expresses frustration over her inability to lose weight despite being on Mounjaro. Increased physical activity due to an upcoming house move involves packing, running around the house, lifting boxes, and making donations. Coffee intake has been reduced from 4 to 5 cups daily to just one cup.  She initiated hormone replacement therapy (HRT) with a patch but discontinued it after a few weeks due to the resurgence of premenstrual syndrome (PMS)-like symptoms, including mood swings and migraines. The migraines were severe enough to disrupt her work. Since discontinuing the patch, she reports no menopausal symptoms such as hot flashes. However, she experiences sleep disturbances and joint pain, particularly in her hips and knees. She is considering natural remedies for these issues. She has attempted to incorporate collagen powder into her diet by adding it to her coffee but found the taste unpalatable. She plans to try it in a smoothie instead. Cognitive function has improved since discontinuing the patch. While the patch seemed to alleviate her joint pain, the debilitating migraines were not worth the trade-off.  She does not monitor her blood glucose levels at home. She has been prescribed Herlene but has not yet started using it. Recently, she picked up her metformin prescription and noticed a change in the dosage instructions on the label, which now indicate a daily dose of one tablet. She has been taking four tablets daily and is unsure if this change was discussed with her or if it was a mistake.  She reports no abdominal pain, hematochezia, constipation, or lower extremity edema. She is currently taking iron  supplements.  Coffee/Tea/Caffeine-containing Drinks: Reduced coffee intake from 4-5 cups daily to one cup Sleep: Reports difficulty sleeping    Last labs reviewed with cholesterol and A1C results below.    Wt Readings from Last 3 Encounters:  10/07/24 188 lb 3.2 oz (85.4 kg)  08/15/24 187 lb 6.4 oz (85 kg)  05/15/24 178 lb (80.7 kg)   Lab Results  Component Value Date   Hemoglobin A1c 12.0 (H) 08/15/2024   Hemoglobin A1c 6.7 (H) 05/15/2024   Hemoglobin A1c 7.8 (H) 11/20/2023   Lab Results  Component Value Date   LDL 74 05/15/2024   Creatinine 0.74 05/15/2024    Past Medical History[1] Medications Taking[2]  Allergies[3]  Social History[4]  Review of Systems:  All other systems reviewed and are negative.   Objective   BP 106/74 (BP Location: Left Upper Arm, Patient Position: Sitting)   Pulse 78   Temp 97.5 F (36.4 C) (Skin)   Ht 5' 3.75 (1.619 m)   Wt 188 lb 3.2 oz (85.4 kg)   LMP  (LMP Unknown)   SpO2 98%   BMI 32.56 kg/m  General:  Well Developed, Well Nourished, Non-distressed. Pleasant and Interactive. Neck:  Supple with normal range of motion, No lymphadenopathy, JVD, bruits, or thyromegaly Cardiovascular:  Heart with regular, rate, and rhythm without murmurs, gallops, or rubs Lungs:  Clear to auscultation anterior and posterior without wheezing, rhonchi, or rales  Abdomen:  Bowel sounds active, soft, non-tender, non-distended, no hepatosplenomegaly or masses palpable.  No bruits auscultated. Skin:  Warm, dry.  No rash.  No open sores or wounds visible. Extremities:  No clubbing, cyanosis, or edema.  Dorsalis  pedis and posterior tibial pulses present and equal.    Impression / Plan   1. Type 2 diabetes mellitus with hyperglycemia, without long-term current use of insulin (*)  Comprehensive Metabolic Panel   Hemoglobin A1c   metFORMIN ER (GLUCOPHAGE-XR) 500 mg 24 hr tablet   Hemoglobin A1c   Comprehensive Metabolic Panel    2. Hyperlipidemia,  unspecified hyperlipidemia type  Lipid Panel With LDL/HDL Ratio   Lipid Panel With LDL/HDL Ratio    3. Menopausal symptoms      4. Screening for colon cancer          Continue with medications as discussed today.  Assessment & Plan 1. Weight management: - She is advised to persist with Mounjaro treatment and maintain a conscious effort in managing her sugar and carbohydrate intake. The potential impact of stress on cortisol levels and subsequent weight gain was discussed. - She is encouraged to incorporate magnesium and turmeric into her diet, as a more natural treatment for joint pain as patient prefers non-Rx management at this time. The use of a frother for collagen powder was suggested.  2. Menopausal symptoms: - She reported that the HRT patch caused a return of period-like symptoms, including migraines and mood swings, which resolved after discontinuing the patch. - She is experiencing some difficulty with sleeping and joint pain. - She is advised to consider holistic approaches such as acupuncture or acupressure for overall menopausal symptom management. The use of chamomile tea and turmeric tea was also recommended for sleep/stress reduction.  3. Blood glucose management: - She is not currently checking her sugar levels at home. Labs will be updated today to assess her current blood glucose levels. - She is advised to continue taking metformin 2 in the morning and 2 in the evening. - A prescription for metformin will be sent to the new pharmacy.  4. Health maintenance: - Her last A1c and cholesterol level were checked in 05/2024. - A Cologuard test will be ordered and delivered to the clinic for her convenience.  Follow-up: She will return during the Christmas break, around 11/03/2024.   Continue to advise pt to follow diabetic diet watching portion sizes as well.  Regular exercise advised.  Patient instructions and education given as below.  The following medication  changes are made: Metformin increased to reflect patient's long term dosing.  Labs ordered today and I will follow up with the patient with any additional recommendations once the results are available.   Advised patient to keep regular med follow up as below and recommend wellness exam once a year. Annual Diabetic Eye Exam Recommended. Reviewed need to been seen here in the office for regular diabetic screenings. A yearly preventative health exam was recommended and current age based recommendations were discussed.  The patient/family voices understanding of all medications. Plan of care is reviewed with the patient/family and barriers to adherence were noted and addressed, or no barriers were noted. Patient agrees to continue taking all medications as prescribed and is currently tolerating them well. For any new medications, the patient/family was instructed of directions for use and the consequences of not taking medication and they were informed about the potential side effects and drug interactions. AVS was given to the patient. Risks, benefits, and alternatives of the medications and treatment plan prescribed today were discussed, and patient expressed understanding. Plan follow-up as discussed or as needed if any worsening symptoms or change in condition.    Follow up in about 4 months (around 02/04/2025) for  Chronic Fasting OV; pls schedule fasting lab visit 11-03-24.  Future Appointments  Date Time Provider Department Center  11/03/2024  8:45 AM [facility] None  02/05/2025  8:15 AM Tinnie FORBES Salt, PA [facility] None       Patient's Medications       * Accurate as of October 07, 2024 10:08 AM. Reflects encounter med changes as of last refresh          Continued Medications      Instructions  atorvastatin calcium 10 mg tablet Commonly known as: LIPITOR  10 mg, Oral, Daily   FLUoxetine 20 MG tablet Commonly known as: PROZAC  20 mg, Daily   FREESTYLE LIBRE READER  Devi  1 each, Does not apply, Daily, Dispense and use as prescribed.   FREESTYLE LIBRE SENSOR SYSTEM Misc  1 each, Does not apply, Daily, Dispense and use as prescribed.   MOUNJARO 5 mg/0.5 mL Soaj pen injection Generic drug: tirzepatide  5 mg, Subcutaneous, Weekly   omeprazole 40 mg capsule Commonly known as: PRILOSEC  40 mg, Daily   SUMAtriptan succinate 50 mg tablet Commonly known as: IMITREX  50 mg, Every 2 hours as needed   TRESIBA FLEXTOUCH 100 UNIT/ML injection Generic drug: Insulin Degludec  20 Units, Subcutaneous, Daily   valsartan 40 MG tablet Commonly known as: DIOVAN  40 mg, Oral, Daily       Modified Medications      Instructions  metFORMIN ER 500 mg 24 hr tablet Commonly known as: GLUCOPHAGE-XR What changed:  how much to take when to take this Changed by: Tinnie Salt  1,000 mg, Oral, 2 times a day       Discontinued Medications    COMBIPATCH 0.05-0.14 MG/DAY Generic drug: estradiol-norethindrone Stopped by: Tinnie Salt        Orders Placed This Encounter  Procedures   Lipid Panel With LDL/HDL Ratio   Comprehensive Metabolic Panel   Hemoglobin A1c   Medication list was reviewed, updated, and a copy provided to patient upon leaving. AI technology was used in creating this visit note. Verbal consent from the patient/caregiver was obtained prior to its use.   Handout given to the patient including the following instructions: There are no Patient Instructions on file for this visit.              [1] Past Medical History: Diagnosis Date   Diabetes mellitus type 2, controlled (*)   [2] Outpatient Medications Marked as Taking for the 10/07/24 encounter (Office Visit) with Tinnie FORBES Salt, PA  Medication Sig Dispense Refill   atorvastatin calcium (LIPITOR) 10 mg tablet Take one tablet (10 mg dose) by mouth daily. 90 tablet 1   Continuous Glucose Receiver (FREESTYLE LIBRE READER) DEVI 1 each by Does not apply route daily. Dispense  and use as prescribed. 1 each 0   Continuous Glucose Sensor (FREESTYLE LIBRE SENSOR SYSTEM) MISC one each by Does not apply route daily. Dispense and use as prescribed. 1 each 0   FLUoxetine (PROZAC) 20 MG tablet Take one tablet (20 mg dose) by mouth daily.     [DISCONTINUED] metFORMIN ER (GLUCOPHAGE-XR) 500 mg 24 hr tablet Take one tablet (500 mg dose) by mouth with supper. 90 tablet 1   omeprazole (PRILOSEC) 40 mg capsule Take one capsule (40 mg dose) by mouth daily.     SUMAtriptan succinate (IMITREX) 50 mg tablet Take one tablet (50 mg dose) by mouth every 2 (two) hours as needed for Migraine.  tirzepatide (MOUNJARO) 5 mg/0.5 mL SOAJ pen injection Inject 0.5 mLs (5 mg dose) into the skin once a week. 2 mL 0   TRESIBA FLEXTOUCH 100 UNIT/ML injection INJECT TWENTY UNITS INTO THE SKIN DAILY. 15 each 0   valsartan (DIOVAN) 40 MG tablet Take one tablet (40 mg dose) by mouth daily. 90 tablet 1  [3] No Known Allergies [4] Social History Socioeconomic History   Marital status: Married  Tobacco Use   Smoking status: Never    Passive exposure: Never   Smokeless tobacco: Never  Vaping Use   Vaping status: Never Used  Substance and Sexual Activity   Alcohol use: Yes    Comment: Socially- holidays   Drug use: Never   Sexual activity: Yes    Partners: Male    Birth control/protection: OCP  *Some images could not be shown.

## 2024-11-06 LAB — COLOGUARD: COLOGUARD: NEGATIVE

## 2024-11-25 ENCOUNTER — Ambulatory Visit
Admission: RE | Admit: 2024-11-25 | Discharge: 2024-11-25 | Disposition: A | Discharge status: Home / Self Care | Source: Ambulatory Visit | Attending: Emergency Medicine | Admitting: Emergency Medicine

## 2024-11-25 VITALS — BP 115/64 | HR 90 | Temp 98.3°F | Resp 16

## 2024-11-25 DIAGNOSIS — N3000 Acute cystitis without hematuria: Secondary | ICD-10-CM | POA: Insufficient documentation

## 2024-11-25 DIAGNOSIS — R300 Dysuria: Secondary | ICD-10-CM | POA: Diagnosis not present

## 2024-11-25 LAB — POCT URINE DIPSTICK
Bilirubin, UA: NEGATIVE
Blood, UA: NEGATIVE
Glucose, UA: NEGATIVE mg/dL
Ketones, POC UA: NEGATIVE mg/dL
Nitrite, UA: NEGATIVE
POC PROTEIN,UA: NEGATIVE
Spec Grav, UA: 1.02
Urobilinogen, UA: 0.2 U/dL
pH, UA: 6

## 2024-11-25 MED ORDER — SULFAMETHOXAZOLE-TRIMETHOPRIM 800-160 MG PO TABS: 1.0000 | ORAL_TABLET | Freq: Two times a day (BID) | ORAL | 0 refills | Status: AC

## 2024-11-25 NOTE — ED Triage Notes (Signed)
 Pt states she had UTI symptoms that began around christmas which she treat with OTC medication. Symptoms came back and she did a tele visit 11/17/24 and was given Macrobid which she completed. Pt presents due to still having urinary discomfort and left lower abdominal discomfort.

## 2024-11-25 NOTE — ED Provider Notes (Signed)
 [facility]    CSN: [id number] Arrival date & time: 11/25/24  1546      History   Chief Complaint Chief Complaint  Patient presents with   Urinary Frequency    HPI Gabriela Fleming is a 54 y.o. female.   Patient presents with continued urinary tract infection symptoms despite being treated with Macrobid via virtual visit on 1/5.  Patient states that she has had continued dysuria and some lower abdominal discomfort since around 12/25.  Patient reports that she was trying to treat this with over-the-counter medication and then had a virtual visit 1/5.  Patient reports that she did take all of the Macrobid as prescribed and her symptoms never improved and she continued to have dysuria, lower abdominal discomfort, and bladder pressure.  Patient denies hematuria, flank pain, and fever.  Patient does report that she has only 1 kidney and therefore becomes very concerned when she has any urinary tract symptoms.  The history is provided by the patient and medical records.  Urinary Frequency    Past Medical History:  Diagnosis Date   Anxiety    Diabetes mellitus without complication [facility])     Patient Active Problem List   Diagnosis Date Noted   Dietary iron deficiency without anemia 11/21/2023   Type 2 diabetes mellitus with hyperglycemia, without long-term current use of insulin (HCC) 11/20/2023   Vertigo 03/31/2022   Anxiety 03/26/2020   Multiple thyroid nodules 05/25/2017   History of uterine fibroid 01/21/2017   Abnormal uterine bleeding 01/03/2017   Cysts of both ovaries 01/03/2017   Flushing 01/03/2017   Obesity with body mass index 30 or greater 01/03/2017   Uterine leiomyoma 01/03/2017   Plantar fasciitis, bilateral 06/08/2014   Acquired absence of kidney 03/06/2014   Migraine 03/06/2014   Premenstrual tension syndrome 03/06/2014   Uncontrolled type 2 diabetes mellitus with hyperglycemia (HCC) 03/06/2014   Hyperlipidemia 05/14/2013    History reviewed. No  pertinent surgical history.  OB History   No obstetric history on file.      Home Medications    Prior to Admission medications  Medication Sig Start Date End Date Taking? Authorizing Provider  sulfamethoxazole -trimethoprim  (BACTRIM  DS) 800-160 MG tablet Take 1 tablet by mouth 2 (two) times daily for 3 days. 11/25/24 11/28/24 Yes Leevi Cullars A, NP  atorvastatin (LIPITOR) 10 MG tablet Take 1 tablet by mouth daily. 05/14/17   [provider]  azithromycin  (ZITHROMAX ) 250 MG tablet Take 1 tablet (250 mg total) by mouth daily. Take first 2 tablets together, then 1 every day until finished. Patient not taking: Reported on 11/25/2024 03/28/24   Mayer, Jodi R, NP  benzonatate  (TESSALON ) 200 MG capsule Take 1 capsule (200 mg total) by mouth 3 (three) times daily as needed. 03/28/24   Loreda Myla SAUNDERS, NP  Blood Glucose Monitoring Suppl (GHT BLOOD GLUCOSE MONITOR) w/Device KIT Use as instructed--provide glucometer which is covered by pt's insurance 03/15/15   [provider]  cetirizine-pseudoephedrine (ZYRTEC-D) 5-120 MG tablet Take 1 tablet by mouth. 01/09/22   [provider]  Continuous Glucose Receiver (FREESTYLE LIBRE READER) DEVI 1 each by Does not apply route. 11/20/23   [provider]  cyclobenzaprine (FLEXERIL) 10 MG tablet Take one tablet at night, or up to three times daily as needed for back pain and spasm 06/16/16   [provider]  dapagliflozin propanediol (FARXIGA) 10 MG TABS tablet Take 1 tablet by mouth daily. 04/25/21   [provider]  empagliflozin (JARDIANCE) 25  MG TABS tablet Take 1 tablet by mouth daily. 05/14/17   [provider]  enalapril (VASOTEC) 10 MG tablet Take 10 mg by mouth daily.    [provider]  enalapril (VASOTEC) 2.5 MG tablet Take 1 tablet by mouth daily. 05/14/17   [provider]  Exenatide ER 2 MG/0.85ML AUIJ Inject 2 mg into the skin. 12/28/16   [provider]  FLUoxetine (PROZAC)  10 MG tablet Take 10 mg by mouth daily.    [provider]  FLUoxetine (PROZAC) 40 MG capsule Take 1 capsule by mouth daily. 11/16/22   [provider]  Fluoxetine HCl, PMDD, 20 MG TABS Take 30 mg by mouth. 05/14/17   [provider]  glucose blood (PRECISION QID TEST) test strip Check glucose in the morning prior to breakfast 03/15/15   [provider]  insulin degludec (TRESIBA FLEXTOUCH) 100 UNIT/ML FlexTouch Pen Inject 20 Units into the skin daily. 01/04/23   [provider]  insulin degludec (TRESIBA FLEXTOUCH) 200 UNIT/ML FlexTouch Pen INJECT 12 UNITS INTO THE SKIN DAILY. 10/21/21   [provider]  insulin lispro (HUMALOG) 100 UNIT/ML KwikPen Inject 6 Units into the skin. 12/13/21   [provider]  Insulin Pen Needle (BD PEN NEEDLE MINI ULTRAFINE) 31G X 5 MM MISC Check once daily 03/06/22   [provider]  Insulin Pen Needle (NOVOFINE PEN NEEDLE) 32G X 6 MM MISC Use once a day for administration of Victoza 05/08/16   [provider]  Liraglutide (VICTOZA [place]) Inject into the skin.    [provider]  metFORMIN (GLUCOPHAGE) 1000 MG tablet Take 1,000 mg by mouth 2 (two) times daily with a meal.    [provider]  metFORMIN (GLUCOPHAGE-XR) 500 MG 24 hr tablet TAKE 4 TABLETS BY MOUTH IN THE EVENING    [provider]  Multiple Vitamin (DAILY VITAMINS) tablet Take 1 tablet by mouth daily.    [provider]  Norethindrone Acetate-Ethinyl Estradiol (MICROGESTIN) 1.5-30 MG-MCG tablet Take 1 tablet by mouth daily.    [provider]  norethindrone-ethinyl estradiol (LOESTRIN) 1-20 MG-MCG tablet Take 1 tablet by mouth daily. 03/30/21   [provider]  ondansetron (ZOFRAN) 8 MG tablet Take 8 mg by mouth. 11/01/22   [provider]  Semaglutide, 2 MG/DOSE, (OZEMPIC, 2 MG/DOSE,) 8 MG/3ML SOPN Inject 2 mg into the skin. 10/19/22   [provider]  SUMAtriptan  (IMITREX) 50 MG tablet PLEASE SEE ATTACHED FOR DETAILED DIRECTIONS 01/24/17   [provider]  tirzepatide (MOUNJARO) 10 MG/0.5ML Pen INJECT 0.5 MLS (10 MG DOSE) INTO THE SKIN ONCE A WEEK.    [provider]  tirzepatide CLOYDE) 12.5 MG/0.5ML Pen Inject 12.5 mg into the skin. 02/21/24   [provider]  tirzepatide CLOYDE) 5 MG/0.5ML Pen INJECT 5 MG SUBCUTANEOUSLY EVERY 7 DAYS 10/24/23   [provider]    Family History History reviewed. No pertinent family history.  Social History Social History[1]   Allergies   Patient has no known allergies.   Review of Systems Review of Systems  Genitourinary:  Positive for frequency.   Per HPI  Physical Exam Triage Vital Signs ED Triage Vitals [11/25/24 1550]  Encounter Vitals Group     BP 115/64     Girls Systolic BP Percentile      Girls Diastolic BP Percentile      Boys Systolic BP Percentile      Boys Diastolic BP Percentile      Pulse  Rate 90     Resp 16     Temp 98.3 F (36.8 C)     Temp Source Oral     SpO2 97 %     Weight      Height      Head Circumference      Peak Flow      Pain Score      Pain Loc      Pain Education      Exclude from Growth Chart    No data found.  Updated Vital Signs BP 115/64 (BP Location: Right Arm)   Pulse 90   Temp 98.3 F (36.8 C) (Oral)   Resp 16   LMP 02/15/2021   SpO2 97%   Visual Acuity Right Eye Distance:   Left Eye Distance:   Bilateral Distance:    Right Eye Near:   Left Eye Near:    Bilateral Near:     Physical Exam Vitals and nursing note reviewed.  Constitutional:      General: She is awake. She is not in acute distress.    Appearance: Normal appearance. She is well-developed and well-groomed. She is not ill-appearing.  Abdominal:     General: Abdomen is flat. Bowel sounds are normal. There is no distension.     Palpations: Abdomen is soft. There is no mass.     Tenderness: There is no abdominal tenderness. There is no  right CVA tenderness, left CVA tenderness, guarding or rebound.     Hernia: No hernia is present.  Skin:    General: Skin is warm and dry.  Neurological:     General: No focal deficit present.     Mental Status: She is alert and oriented to person, place, and time. Mental status is at baseline.  Psychiatric:        Behavior: Behavior is cooperative.      UC Treatments / Results  Labs (all labs ordered are listed, but only abnormal results are displayed) Labs Reviewed  POCT URINE DIPSTICK - Abnormal; Notable for the following components:      Result Value   Clarity, UA cloudy (*)    Leukocytes, UA Trace (*)    All other components within normal limits  URINE CULTURE    EKG   Radiology No results found.  Procedures Procedures (including critical care time)  Medications Ordered in UC Medications - No data to display  Initial Impression / Assessment and Plan / UC Course  I have reviewed the triage vital signs and the nursing notes.  Pertinent labs & imaging results that were available during my care of the patient were reviewed by me and considered in my medical decision making (see chart for details).     Patient is overall well-appearing.  Vitals are stable.  No significant findings noted on exam.  Urinalysis reveals trace leukocytes, will send urine culture to confirm presence of urinary tract infection.  Empirically treating for acute cystitis with Bactrim .  Discussed follow-up, return, and strict ER precautions. Final Clinical Impressions(s) / UC Diagnoses   Final diagnoses:  Dysuria  Acute cystitis without hematuria     Discharge Instructions      Start taking Bactrim  twice daily for 3 days for urinary tract infection. Your urine culture results will return over the next few days and someone will call if results require any adjustment in treatment. If you develop severe back pain, severe abdominal pain, or fever please seek immediate medical treatment in  the emergency department.  ED Prescriptions     Medication Sig Dispense Auth. Provider   sulfamethoxazole -trimethoprim  (BACTRIM  DS) 800-160 MG tablet Take 1 tablet by mouth 2 (two) times daily for 3 days. 6 tablet Johnie Flaming A, NP      PDMP not reviewed this encounter.    [1]  Social History Tobacco Use   Smoking status: Never   Smokeless tobacco: Never  Substance Use Topics   Alcohol use: No   Drug use: No     Johnie Flaming LABOR, NP 11/25/24 1625  "

## 2024-11-25 NOTE — Discharge Instructions (Signed)
 Start taking Bactrim  twice daily for 3 days for urinary tract infection. Your urine culture results will return over the next few days and someone will call if results require any adjustment in treatment. If you develop severe back pain, severe abdominal pain, or fever please seek immediate medical treatment in the emergency department.

## 2024-11-26 LAB — URINE CULTURE

## 2024-11-27 ENCOUNTER — Ambulatory Visit (HOSPITAL_COMMUNITY): Payer: Self-pay

## 2024-11-28 ENCOUNTER — Ambulatory Visit
Admission: EM | Admit: 2024-11-28 | Discharge: 2024-11-28 | Disposition: A | Discharge status: Home / Self Care | Attending: Emergency Medicine | Admitting: Emergency Medicine

## 2024-11-28 ENCOUNTER — Encounter: Payer: Self-pay | Admitting: Emergency Medicine

## 2024-11-28 DIAGNOSIS — R350 Frequency of micturition: Secondary | ICD-10-CM | POA: Insufficient documentation

## 2024-11-28 NOTE — ED Triage Notes (Signed)
 Pt presents for recollect of urine for culture due to specimen having to many species present. Pt states they abx she is taking has not helped much.

## 2024-11-29 LAB — URINE CULTURE: Culture: <10000 — AB
